# Patient Record
Sex: Female | Born: 1970 | Race: White | Hispanic: No | Marital: Married | Smoking: Never smoker
Health system: Southern US, Community
[De-identification: ages and names within clinical notes are randomized; demographics above are authoritative.]

## PROBLEM LIST (undated history)

## (undated) DIAGNOSIS — I10 Essential (primary) hypertension: Secondary | ICD-10-CM

## (undated) HISTORY — PX: DILATION AND CURETTAGE OF UTERUS: SHX78

## (undated) HISTORY — DX: Essential (primary) hypertension: I10

## (undated) HISTORY — PX: OTHER SURGICAL HISTORY: SHX169

---

## 1998-02-18 ENCOUNTER — Ambulatory Visit (HOSPITAL_COMMUNITY): Admission: RE | Admit: 1998-02-18 | Discharge: 1998-02-18 | Payer: Self-pay | Admitting: Obstetrics and Gynecology

## 1998-07-12 ENCOUNTER — Encounter: Payer: Self-pay | Admitting: Obstetrics and Gynecology

## 1998-07-12 ENCOUNTER — Ambulatory Visit (HOSPITAL_COMMUNITY): Admission: RE | Admit: 1998-07-12 | Discharge: 1998-07-12 | Payer: Self-pay | Admitting: Obstetrics and Gynecology

## 2000-02-19 ENCOUNTER — Other Ambulatory Visit: Admission: RE | Admit: 2000-02-19 | Discharge: 2000-02-19 | Payer: Self-pay | Admitting: Obstetrics and Gynecology

## 2000-09-14 ENCOUNTER — Inpatient Hospital Stay (HOSPITAL_COMMUNITY): Admission: AD | Admit: 2000-09-14 | Discharge: 2000-09-17 | Payer: Self-pay | Admitting: Obstetrics and Gynecology

## 2000-10-10 ENCOUNTER — Other Ambulatory Visit: Admission: RE | Admit: 2000-10-10 | Discharge: 2000-10-10 | Payer: Self-pay | Admitting: Obstetrics and Gynecology

## 2001-07-21 ENCOUNTER — Inpatient Hospital Stay (HOSPITAL_COMMUNITY): Admission: AD | Admit: 2001-07-21 | Discharge: 2001-07-21 | Payer: Self-pay | Admitting: Obstetrics and Gynecology

## 2001-07-28 ENCOUNTER — Inpatient Hospital Stay (HOSPITAL_COMMUNITY): Admission: AD | Admit: 2001-07-28 | Discharge: 2001-07-30 | Payer: Self-pay | Admitting: *Deleted

## 2001-09-11 ENCOUNTER — Other Ambulatory Visit: Admission: RE | Admit: 2001-09-11 | Discharge: 2001-09-11 | Payer: Self-pay | Admitting: Obstetrics and Gynecology

## 2002-09-25 ENCOUNTER — Other Ambulatory Visit: Admission: RE | Admit: 2002-09-25 | Discharge: 2002-09-25 | Payer: Self-pay | Admitting: Obstetrics and Gynecology

## 2003-09-28 ENCOUNTER — Other Ambulatory Visit: Admission: RE | Admit: 2003-09-28 | Discharge: 2003-09-28 | Payer: Self-pay | Admitting: Obstetrics and Gynecology

## 2005-10-25 ENCOUNTER — Emergency Department (HOSPITAL_COMMUNITY): Admission: EM | Admit: 2005-10-25 | Discharge: 2005-10-25 | Payer: Self-pay | Admitting: Emergency Medicine

## 2008-04-21 ENCOUNTER — Emergency Department (HOSPITAL_BASED_OUTPATIENT_CLINIC_OR_DEPARTMENT_OTHER): Admission: EM | Admit: 2008-04-21 | Discharge: 2008-04-21 | Payer: Self-pay | Admitting: Emergency Medicine

## 2008-07-29 ENCOUNTER — Encounter: Admission: RE | Admit: 2008-07-29 | Discharge: 2008-07-29 | Payer: Self-pay | Admitting: Gastroenterology

## 2010-04-20 ENCOUNTER — Encounter: Admission: RE | Admit: 2010-04-20 | Discharge: 2010-04-20 | Payer: Self-pay | Admitting: Obstetrics and Gynecology
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
## 2010-10-20 NOTE — Op Note (Signed)
[facility]  Patient:    Brenda Roberson, Brenda Roberson                        MRN: [id number] Proc. Date: 09/15/00 Adm. Date:  [id number] Attending:  Genia Del                           Operative Report  INDICATIONS:                  For operative delivery and maternal exhaustion, prolonged second stage and nonreassuring fetal heart rate status.  DESCRIPTION OF PROCEDURE:     After being apprised of the risks and benefits of vacuum-assisted vaginal delivery, a Mityvac mushroom cup is applied to OA presentation after Foley catheter has been removed within 15 minutes after proper presentation.  Two pulls during contractions with gentle lateral traction for delivery of a full-term living female, Apgars 9 and 9, over central median episiotomy is accomplished.  Placenta delivered spontaneously intact.  Three-vessel cord noted.  No lacerations or extensions are noted.  No cervical lacerations are noted.  Repair of episiotomy with a 3-0 Monocryl in the standard fashion is performed.  Estimated blood loss 300 cc.  No complications.  Baby and mother are recovering well.  The patient tolerated the procedure well.DD:  09/15/00 TD:  09/15/00 Job: [id number]

## 2010-10-20 NOTE — H&P (Signed)
[facility]  Patient:    Brenda Roberson, Brenda Roberson Visit Number: [id number] MRN: [id number]          Service Type: OBS Location: 910B [id number] [id number] Attending Physician:  Ermalene Searing Dictated by:   Lenoard Aden, M.D. Admit Date:  07/28/2001                           History and Physical  CHIEF COMPLAINT:              Labor.  HISTORY OF PRESENT ILLNESS:   The patient is a 40 year old white female, G4, P48, EDD of August 05, 2001, at 38+ weeks who presents for contractions every five minutes for admission and augmentation of labor as needed.  ALLERGIES:                    No known drug allergies.  MEDICATIONS:                  Prenatal vitamins.  PAST OBSTETRIC HISTORY:       1. History of SAB 1999 and 2000.                               2. Uncomplicated vaginal delivery April 2002.  FAMILY HISTORY:               Myocardial infarction, hypertension, insulin-dependent diabetes, and lung cancer.  PRENATAL LABORATORY DATA:     Blood type A positive, Rh antibody negative, Rubella immune, hepatitis B surface antigen negative, HIV nonreactive. PHYSICAL EXAMINATION:  GENERAL:                      Well-developed, well-nourished, white female in no apparent distress.  HEENT:                        Normal.  LUNGS:                        Clear.  HEART:                        Regular rate and rhythm.  ABDOMEN:                      Soft, gravid, nontender.  PELVIC:                       Cervix is 3 cm, 60% vertex, -1.  EXTREMITIES:                  Feel no cords.  NEUROLOGIC:                   Nonfocal.  IMPRESSION:                   A 38+ week intrauterine pregnancy in early labor.  PLAN:                         Proceed with admission and group B streptococcus prophylaxis. Dictated by:   Lenoard Aden, M.D. Attending Physician:  Marina Gravel B DD:  07/28/01 TD:  07/28/01 Job: [id number]

## 2011-03-07 LAB — CBC
Hemoglobin: 13.9
MCHC: 34.2
MCV: 90.9
RBC: 4.45
WBC: 8.4

## 2011-03-07 LAB — URINALYSIS, ROUTINE W REFLEX MICROSCOPIC
Bilirubin Urine: NEGATIVE
Hgb urine dipstick: NEGATIVE
Nitrite: NEGATIVE
Specific Gravity, Urine: 1.011
pH: 6

## 2011-03-07 LAB — DIFFERENTIAL
Lymphocytes Relative: 22
Monocytes Absolute: 0.4
Monocytes Relative: 5
Neutro Abs: 6.2

## 2011-03-07 LAB — COMPREHENSIVE METABOLIC PANEL
AST: 20
Albumin: 4.3
CO2: 26
Chloride: 105
Creatinine, Ser: 0.7
Glucose, Bld: 93
Sodium: 140
Total Protein: 7.4

## 2012-11-02 ENCOUNTER — Emergency Department (HOSPITAL_BASED_OUTPATIENT_CLINIC_OR_DEPARTMENT_OTHER)
Admission: EM | Admit: 2012-11-02 | Discharge: 2012-11-03 | Disposition: A | Discharge status: Home / Self Care | Payer: BC Managed Care – PPO | Attending: Emergency Medicine | Admitting: Emergency Medicine

## 2012-11-02 ENCOUNTER — Encounter (HOSPITAL_BASED_OUTPATIENT_CLINIC_OR_DEPARTMENT_OTHER): Payer: Self-pay

## 2012-11-02 ENCOUNTER — Emergency Department (HOSPITAL_BASED_OUTPATIENT_CLINIC_OR_DEPARTMENT_OTHER): Payer: BC Managed Care – PPO

## 2012-11-02 DIAGNOSIS — Z8719 Personal history of other diseases of the digestive system: Secondary | ICD-10-CM | POA: Insufficient documentation

## 2012-11-02 DIAGNOSIS — Z3202 Encounter for pregnancy test, result negative: Secondary | ICD-10-CM | POA: Insufficient documentation

## 2012-11-02 DIAGNOSIS — R102 Pelvic and perineal pain: Secondary | ICD-10-CM

## 2012-11-02 DIAGNOSIS — Z79899 Other long term (current) drug therapy: Secondary | ICD-10-CM | POA: Insufficient documentation

## 2012-11-02 DIAGNOSIS — N949 Unspecified condition associated with female genital organs and menstrual cycle: Secondary | ICD-10-CM | POA: Insufficient documentation

## 2012-11-02 DIAGNOSIS — K921 Melena: Secondary | ICD-10-CM | POA: Insufficient documentation

## 2012-11-02 DIAGNOSIS — R110 Nausea: Secondary | ICD-10-CM | POA: Insufficient documentation

## 2012-11-02 DIAGNOSIS — Z8742 Personal history of other diseases of the female genital tract: Secondary | ICD-10-CM | POA: Insufficient documentation

## 2012-11-02 LAB — URINALYSIS, ROUTINE W REFLEX MICROSCOPIC
Glucose, UA: NEGATIVE mg/dL
Hgb urine dipstick: NEGATIVE
Specific Gravity, Urine: 1.021 (ref 1.005–1.030)
pH: 6 (ref 5.0–8.0)

## 2012-11-02 LAB — WET PREP, GENITAL
Clue Cells Wet Prep HPF POC: NONE SEEN
Trich, Wet Prep: NONE SEEN

## 2012-11-02 LAB — PREGNANCY, URINE: Preg Test, Ur: NEGATIVE

## 2012-11-02 MED ORDER — IOHEXOL 300 MG/ML  SOLN: 50.0000 mL | Freq: Once | INTRAMUSCULAR | Status: AC | PRN

## 2012-11-02 MED ORDER — ONDANSETRON 4 MG PO TBDP
ORAL_TABLET | ORAL | Status: AC
  Administered 2012-11-02: 4 mg
  Filled 2012-11-02: qty 1

## 2012-11-02 MED ORDER — ONDANSETRON HCL 8 MG PO TABS: 4.0000 mg | ORAL_TABLET | Freq: Once | ORAL | Status: DC

## 2012-11-02 MED ORDER — HYDROCODONE-ACETAMINOPHEN 5-325 MG PO TABS
1.0000 | ORAL_TABLET | Freq: Once | ORAL | Status: AC
  Administered 2012-11-02: 1 via ORAL
  Filled 2012-11-02: qty 1

## 2012-11-02 NOTE — ED Notes (Signed)
Patient here with lower abdominal pain that she reports as constant since early am. Reports constipation since last pm and noticed blood in her stool. Nausea with same. Denies urinary symptoms

## 2012-11-02 NOTE — ED Provider Notes (Signed)
History     CSN: [id number]  Arrival date & time 11/02/12  4098   First MD Initiated Contact with Patient 11/02/12 2157      Chief Complaint  Patient presents with  . Abdominal Pain    (Consider location/radiation/quality/duration/timing/severity/associated sxs/prior treatment) Patient is a 42 y.o. female presenting with abdominal pain. The history is provided by the patient. No language interpreter was used.  Abdominal Pain This is a new problem. The current episode started today. The problem occurs constantly. Associated symptoms include abdominal pain and nausea. Pertinent negatives include no chest pain, fever, myalgias, rash or weakness. Associated symptoms comments: Lower abdominal pain that has been constant since this morning. No fever or vomiting. Last bowel movement was yesterday, which is not her usual habit. She also reports seeing blood in her stool yesterday. 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 Loreta Ave. No dysuria or vaginal discharge. Her menses have been regular. .    History reviewed. No pertinent past medical history.  History reviewed. No pertinent past surgical history.  No family history on file.  History  Substance Use Topics  . Smoking status: Never Smoker   . Smokeless tobacco: Not on file  . Alcohol Use: Not on file    OB History   Grav Para Term Preterm Abortions TAB SAB Ect Mult Living                  Review of Systems  Constitutional: Negative.  Negative for fever.  Respiratory: Negative for shortness of breath.   Cardiovascular: Negative for chest pain.  Gastrointestinal: Positive for nausea, abdominal pain and blood in stool. Negative for diarrhea.  Genitourinary: Negative for dysuria, vaginal bleeding and vaginal discharge.  Musculoskeletal: Negative for myalgias.  Skin: Negative for rash.  Neurological: Negative for weakness.    Allergies  Review of patient's allergies indicates no known allergies.  Home Medications    Current Outpatient Rx  Name  Route  Sig  Dispense  Refill  . cetirizine (ZYRTEC) 10 MG tablet   Oral   Take 10 mg by mouth daily.           BP 161/81  Pulse 68  Temp(Src) 98.4 F (36.9 C) (Oral)  Ht 5' 2.5" (1.588 m)  Wt 182 lb (82.555 kg)  BMI 32.74 kg/m2  SpO2 98%  LMP 10/26/2012  Physical Exam  Constitutional: She is oriented to person, place, and time. She appears well-developed and well-nourished.  HENT:  Head: Normocephalic.  Neck: Normal range of motion. Neck supple.  Cardiovascular: Normal rate and regular rhythm.   Pulmonary/Chest: Effort normal and breath sounds normal.  Abdominal: Soft. Bowel sounds are normal. There is tenderness. There is no rebound and no guarding.  Suprapubic and LLQ abdominal tenderness without guarding.   Genitourinary:  No cervical discharge. Mild midline pelvic and left adnexal tenderness without mass.   Musculoskeletal: Normal range of motion.  Neurological: She is alert and oriented to person, place, and time.  Skin: Skin is warm and dry. No rash noted.  Psychiatric: She has a normal mood and affect.    ED Course  Procedures (including critical care time)  Labs Reviewed  WET PREP, GENITAL  GC/CHLAMYDIA PROBE AMP  URINALYSIS, ROUTINE W REFLEX MICROSCOPIC  PREGNANCY, URINE  OCCULT BLOOD X 1 CARD TO LAB, STOOL  CBC WITH DIFFERENTIAL  BASIC METABOLIC PANEL   No results found.   No diagnosis found.    MDM  Patient with significant abdominal pain and history  of IBS, as well as ovarian cyst. Ct to evaluate further. Patient care transferred to Dr. Read Drivers pending blood studies and CT. Patient and husband made aware of plan.       Arnoldo Hooker, PA-C 11/02/12 2351

## 2012-11-03 LAB — CBC WITH DIFFERENTIAL/PLATELET
Eosinophils Absolute: 0.1 10*3/uL (ref 0.0–0.7)
Lymphocytes Relative: 32 % (ref 12–46)
Lymphs Abs: 2.1 10*3/uL (ref 0.7–4.0)
MCH: 32.2 pg (ref 26.0–34.0)
Neutrophils Relative %: 59 % (ref 43–77)
Platelets: 241 10*3/uL (ref 150–400)
RBC: 4.41 MIL/uL (ref 3.87–5.11)
WBC: 6.6 10*3/uL (ref 4.0–10.5)

## 2012-11-03 LAB — GC/CHLAMYDIA PROBE AMP
CT Probe RNA: NEGATIVE
GC Probe RNA: NEGATIVE

## 2012-11-03 LAB — BASIC METABOLIC PANEL
GFR calc non Af Amer: 89 mL/min — ABNORMAL LOW (ref >90)
Glucose, Bld: 104 mg/dL — ABNORMAL HIGH (ref 70–99)
Potassium: 4.1 mEq/L (ref 3.5–5.1)
Sodium: 140 mEq/L (ref 135–145)

## 2012-11-03 MED ORDER — IOHEXOL 300 MG/ML  SOLN
50.0000 mL | Freq: Once | INTRAMUSCULAR | Status: AC | PRN
  Administered 2012-11-02: 50 mL via ORAL

## 2012-11-03 MED ORDER — IOHEXOL 300 MG/ML  SOLN
100.0000 mL | Freq: Once | INTRAMUSCULAR | Status: AC | PRN
  Administered 2012-11-03: 100 mL via INTRAVENOUS

## 2012-11-03 NOTE — ED Provider Notes (Signed)
Medical screening examination/treatment/procedure(s) were conducted as a shared visit with non-physician practitioner(s) and myself.  I personally evaluated the patient during the encounter  Nursing notes and vitals signs, including pulse oximetry, reviewed.  Summary of this visit's results, reviewed by myself:  Labs:  Results for orders placed during the hospital encounter of 11/02/12 (from the past 24 hour(s))  URINALYSIS, ROUTINE W REFLEX MICROSCOPIC     Status: None   Collection Time    11/02/12  8:34 PM      Result Value Range   Color, Urine YELLOW  YELLOW   APPearance CLEAR  CLEAR   Specific Gravity, Urine 1.021  1.005 - 1.030   pH 6.0  5.0 - 8.0   Glucose, UA NEGATIVE  NEGATIVE mg/dL   Hgb urine dipstick NEGATIVE  NEGATIVE   Bilirubin Urine NEGATIVE  NEGATIVE   Ketones, ur NEGATIVE  NEGATIVE mg/dL   Protein, ur NEGATIVE  NEGATIVE mg/dL   Urobilinogen, UA 0.2  0.0 - 1.0 mg/dL   Nitrite NEGATIVE  NEGATIVE   Leukocytes, UA NEGATIVE  NEGATIVE  PREGNANCY, URINE     Status: None   Collection Time    11/02/12  8:34 PM      Result Value Range   Preg Test, Ur NEGATIVE  NEGATIVE  OCCULT BLOOD X 1 CARD TO LAB, STOOL     Status: None   Collection Time    11/02/12 11:21 PM      Result Value Range   Fecal Occult Bld NEGATIVE  NEGATIVE  WET PREP, GENITAL     Status: None   Collection Time    11/02/12 11:29 PM      Result Value Range   Yeast Wet Prep HPF POC NONE SEEN  NONE SEEN   Trich, Wet Prep NONE SEEN  NONE SEEN   Clue Cells Wet Prep HPF POC NONE SEEN  NONE SEEN   WBC, Wet Prep HPF POC NONE SEEN  NONE SEEN  CBC WITH DIFFERENTIAL     Status: None   Collection Time    11/02/12 11:59 PM      Result Value Range   WBC 6.6  4.0 - 10.5 K/uL   RBC 4.41  3.87 - 5.11 MIL/uL   Hemoglobin 14.2  12.0 - 15.0 g/dL   HCT 10.2  72.5 - 36.6 %   MCV 91.6  78.0 - 100.0 fL   MCH 32.2  26.0 - 34.0 pg   MCHC 35.1  30.0 - 36.0 g/dL   RDW 44.0  34.7 - 42.5 %   Platelets 241  150 - 400 K/uL    Neutrophils Relative % 59  43 - 77 %   Neutro Abs 3.9  1.7 - 7.7 K/uL   Lymphocytes Relative 32  12 - 46 %   Lymphs Abs 2.1  0.7 - 4.0 K/uL   Monocytes Relative 8  3 - 12 %   Monocytes Absolute 0.5  0.1 - 1.0 K/uL   Eosinophils Relative 1  0 - 5 %   Eosinophils Absolute 0.1  0.0 - 0.7 K/uL   Basophils Relative 0  0 - 1 %   Basophils Absolute 0.0  0.0 - 0.1 K/uL  BASIC METABOLIC PANEL     Status: Abnormal   Collection Time    11/02/12 11:59 PM      Result Value Range   Sodium 140  135 - 145 mEq/L   Potassium 4.1  3.5 - 5.1 mEq/L   Chloride 103  96 -  112 mEq/L   CO2 26  19 - 32 mEq/L   Glucose, Bld 104 (*) 70 - 99 mg/dL   BUN 11  6 - 23 mg/dL   Creatinine, Ser 1.91  0.50 - 1.10 mg/dL   Calcium 9.3  8.4 - 47.8 mg/dL   GFR calc non Af Amer 89 (*) >90 mL/min   GFR calc Af Amer >90  >90 mL/min    Imaging Studies: Ct Abdomen Pelvis W Contrast  11/03/2012   *RADIOLOGY REPORT*  Clinical Data: Low abdominal pain, constipation  CT ABDOMEN AND PELVIS WITH CONTRAST  Technique:  Multidetector CT imaging of the abdomen and pelvis was performed following the standard protocol during bolus administration of intravenous contrast.  Contrast: OMNIPAQUE IOHEXOL 300 MG/ML  SOLN 07/29/2008  Comparison: 07/29/2008  Findings: The lung bases are clear.  Small hiatal hernia noted. Liver, gallbladder, spleen, pancreas, adrenal glands, and kidneys are normal.  No free air or fluid.  Uterus and ovaries are normal.  No pelvic free fluid.  The appendix is normal, image 56.  No bowel wall thickening or focal segmental dilatation.  No acute osseous abnormality.  IMPRESSION: No acute intra-abdominal or pelvic pathology.   Original Report Authenticated By: Christiana Pellant, M.D.    1:25 AM Patient states she feels better. Mild left lower quadrant tenderness remains without guarding or rebound. She was advised that a pelvic ultrasound may be indicated, she will follow up with her OB/GYN regarding this.  Hanley Seamen, MD 11/03/12 [phone]

## 2012-11-03 NOTE — ED Notes (Signed)
Patient back from CT.

## 2012-11-05 NOTE — ED Provider Notes (Signed)
Medical screening examination/treatment/procedure(s) were performed by non-physician practitioner and as supervising physician I was immediately available for consultation/collaboration.   Anieya Helman W. Zayah Keilman, MD 11/05/12 0903 

## 2013-11-13 ENCOUNTER — Other Ambulatory Visit: Payer: Self-pay | Admitting: Family Medicine

## 2013-11-13 DIAGNOSIS — L989 Disorder of the skin and subcutaneous tissue, unspecified: Secondary | ICD-10-CM

## 2013-11-16 ENCOUNTER — Ambulatory Visit
Admission: RE | Admit: 2013-11-16 | Discharge: 2013-11-16 | Disposition: A | Discharge status: Home / Self Care | Payer: BC Managed Care – PPO | Source: Ambulatory Visit | Attending: Family Medicine | Admitting: Family Medicine

## 2013-11-16 DIAGNOSIS — L989 Disorder of the skin and subcutaneous tissue, unspecified: Secondary | ICD-10-CM

## 2014-12-15 IMAGING — CT CT ABD-PELV W/ CM
2 of 5 series · 17 of 46 positions shown, 19 images · IV contrast (APPLIED)
Comparison: 07/29/2008

CLINICAL DATA: Low abdominal pain, constipation

CT ABDOMEN AND PELVIS WITH CONTRAST
TECHNIQUE: Multidetector CT imaging of the abdomen and pelvis was
performed following the standard protocol during bolus
administration of intravenous contrast.
Contrast: 100mL OMNIPAQUE IOHEXOL 300 MG/ML  SOLN 07/29/2008

[Series 2: abd/pelvis 5.0 b31f · axial · 0.80mm/px · z∈[-445,+0]mm · 14 of 99 slices shown, 16 images]
[im 5/99  soft-tissue]
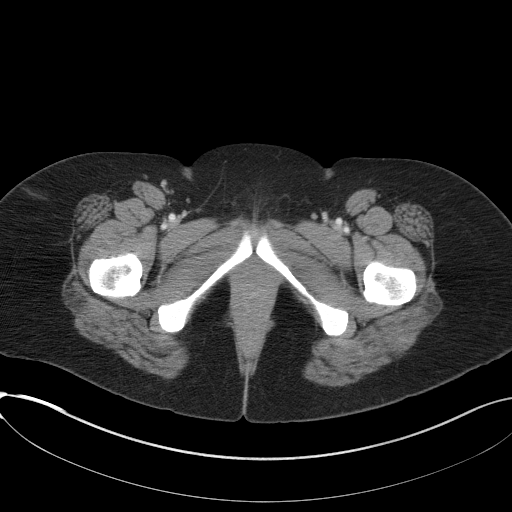
[im 5/99  bone]
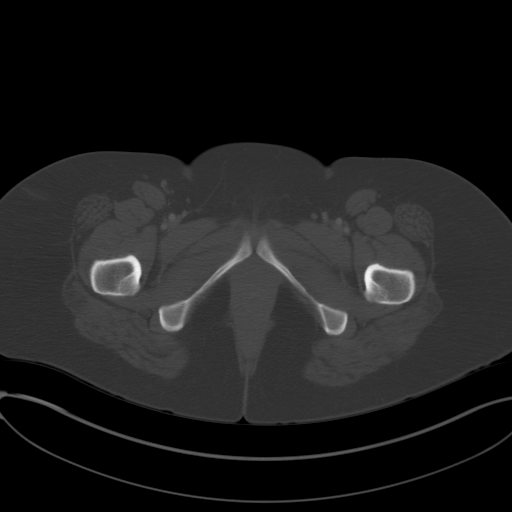
[im 15/99  soft-tissue]
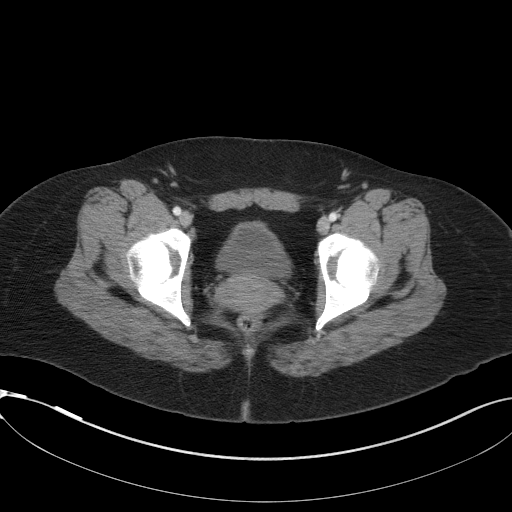
[im 20/99  soft-tissue]
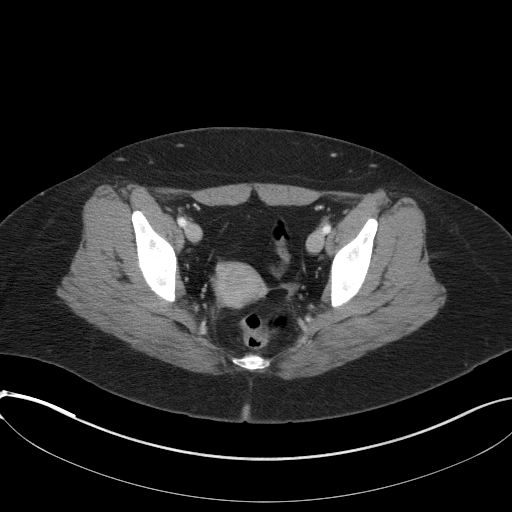
[im 25/99  soft-tissue]
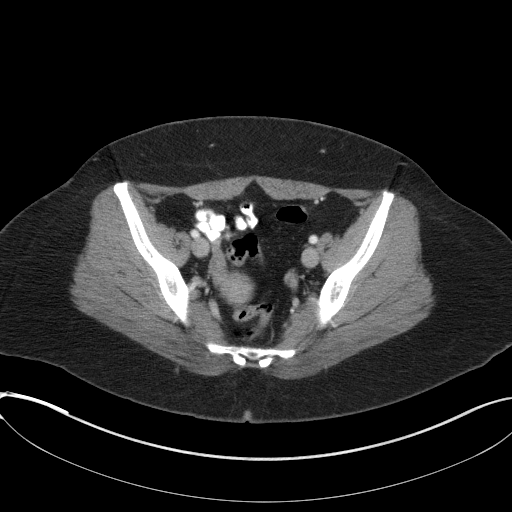
[im 35/99  soft-tissue]
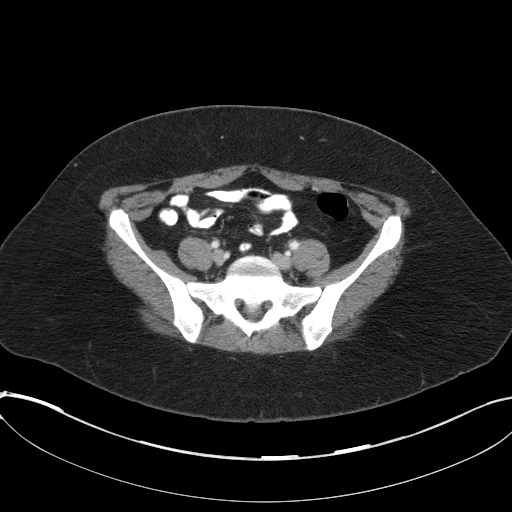
[im 40/99  soft-tissue]
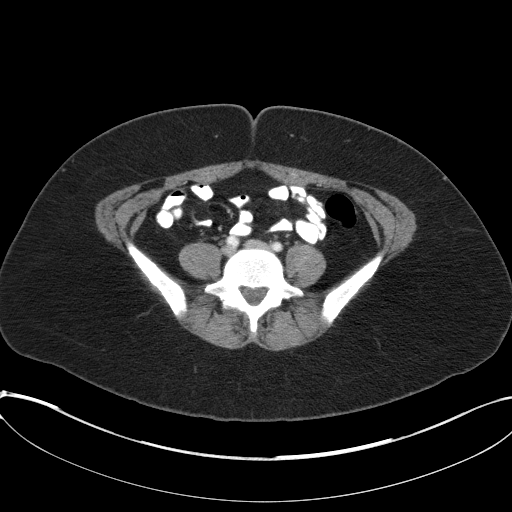
[im 45/99  soft-tissue]
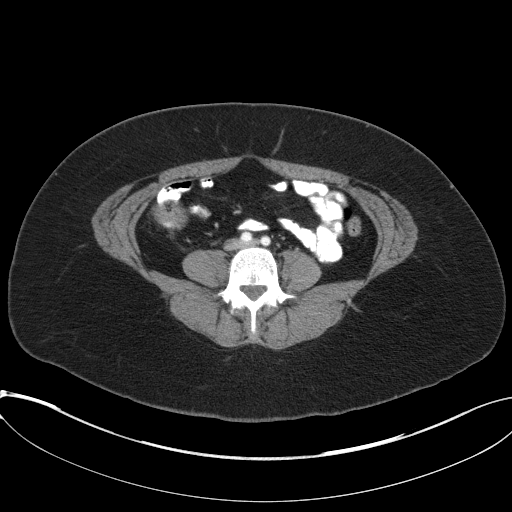
[im 54/99  soft-tissue]
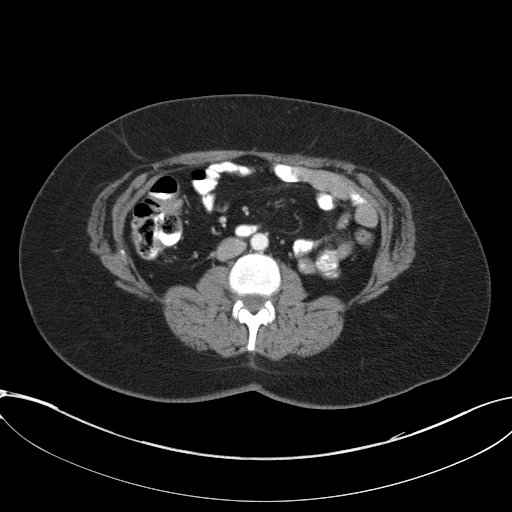
[im 59/99  soft-tissue]
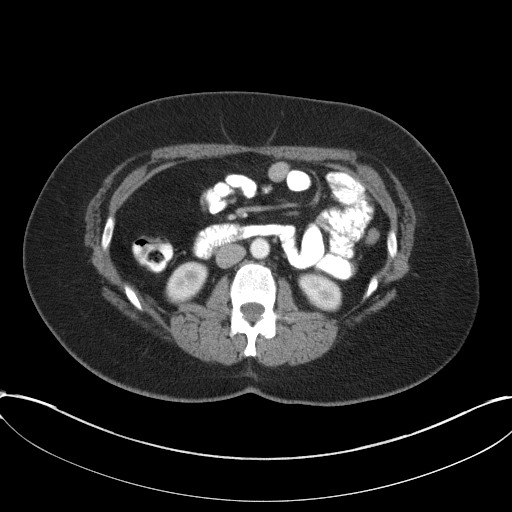
[im 59/99  bone]
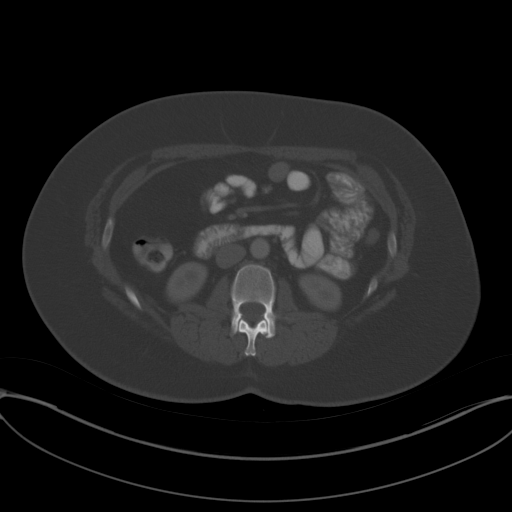
[im 64/99  soft-tissue]
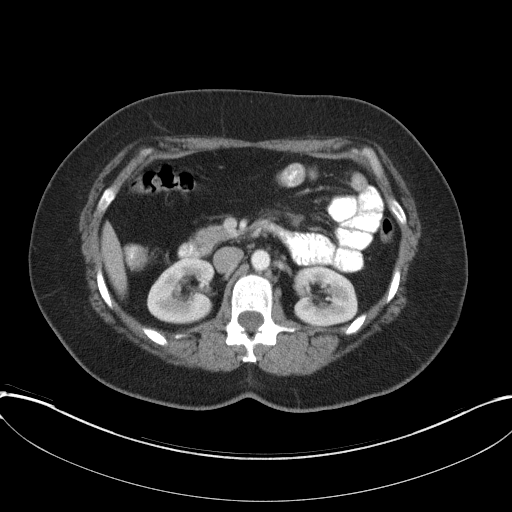
[im 74/99  soft-tissue]
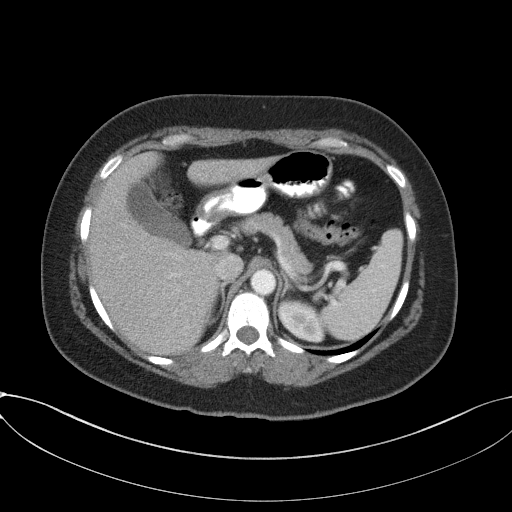
[im 79/99  soft-tissue]
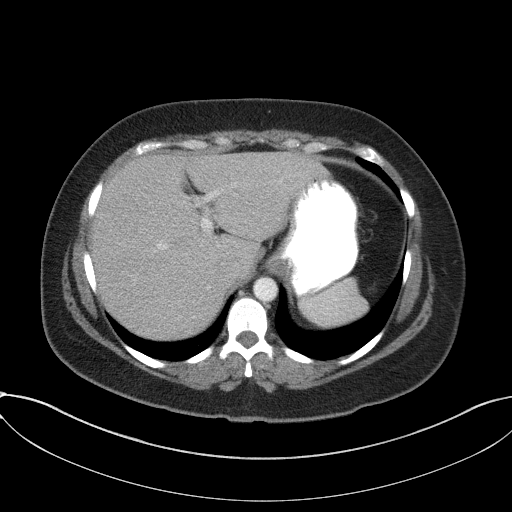
[im 84/99  soft-tissue]
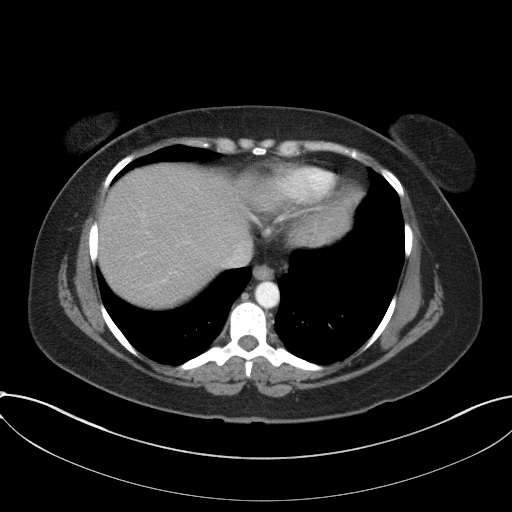
[im 94/99  soft-tissue]
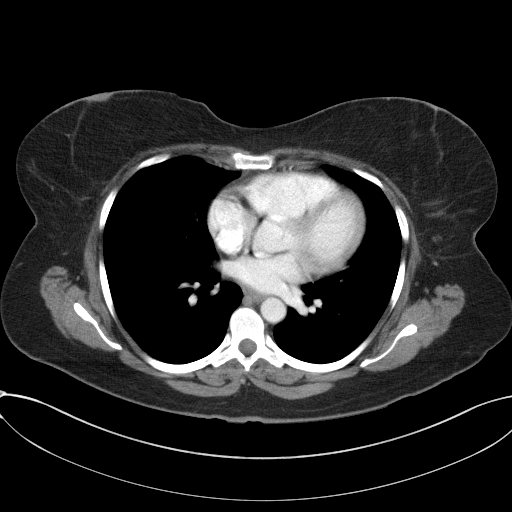

[Series 5: abd/pelvis 3.0 coronal · coronal · 1.08mm/px · 3 of 81 slices shown]
[im 27/81  soft-tissue]
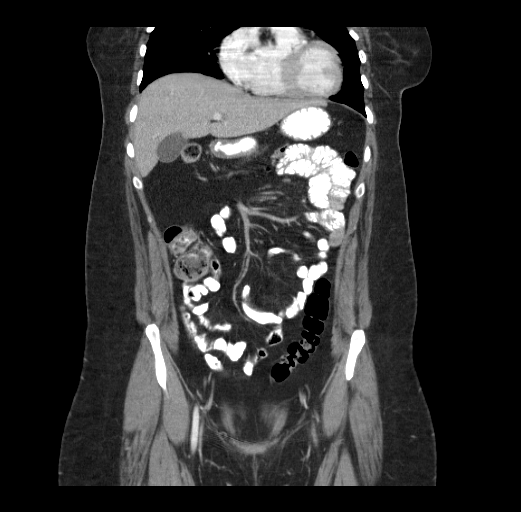
[im 36/81  soft-tissue]
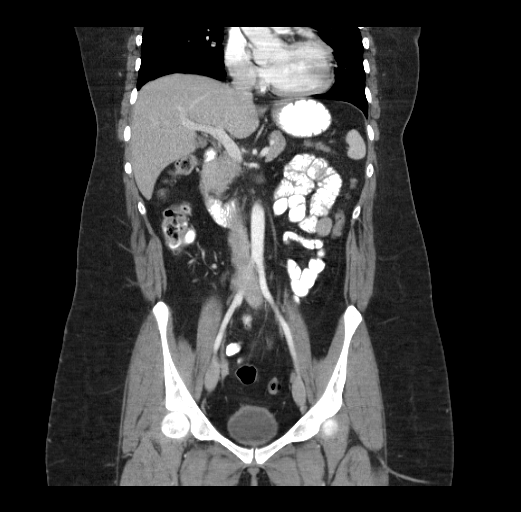
[im 45/81  soft-tissue]
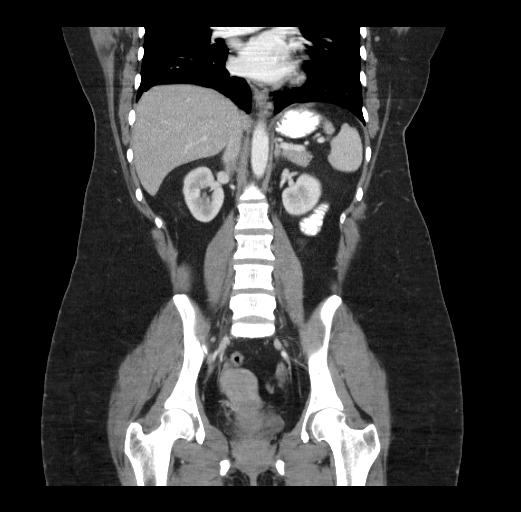

[17 of 46 positions shown; findings below may reference images not displayed]

FINDINGS: The lung bases are clear.  Small hiatal hernia noted.
Liver, gallbladder, spleen, pancreas, adrenal glands, and kidneys
are normal.  No free air or fluid.

Uterus and ovaries are normal.  No pelvic free fluid.  The appendix
is normal, image 56.  No bowel wall thickening or focal segmental
dilatation.  No acute osseous abnormality.
IMPRESSION: No acute intra-abdominal or pelvic pathology.

## 2015-12-28 IMAGING — US US EXTREM LOW*R* LIMITED
1 series · 14 of 18 positions shown · non-contrast
Comparison: Non

CLINICAL DATA: Skin lesion for 3 years.

EXAM:
ULTRASOUND Right LOWER EXTREMITY LIMITED
TECHNIQUE: Ultrasound examination of the lower extremity soft tissues was
performed in the area of clinical concern.

[Series 1: us extrem low*right* limited · 0.05mm/px · 14 of 18 slices shown]
[im 1/18]
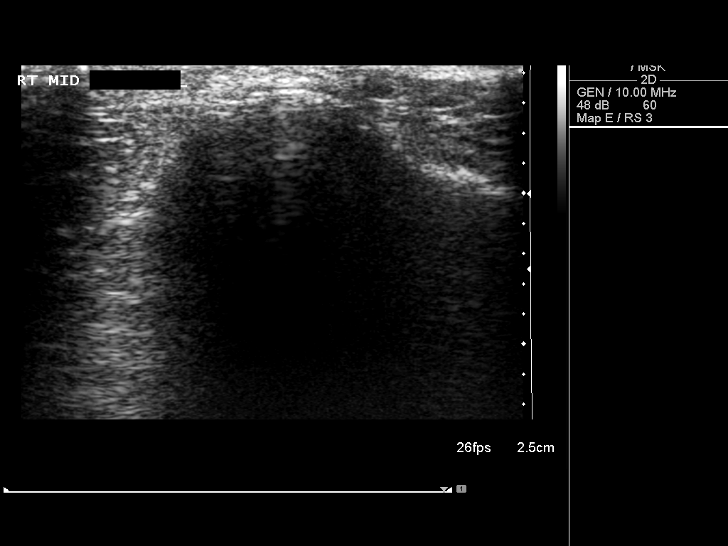
[im 2/18]
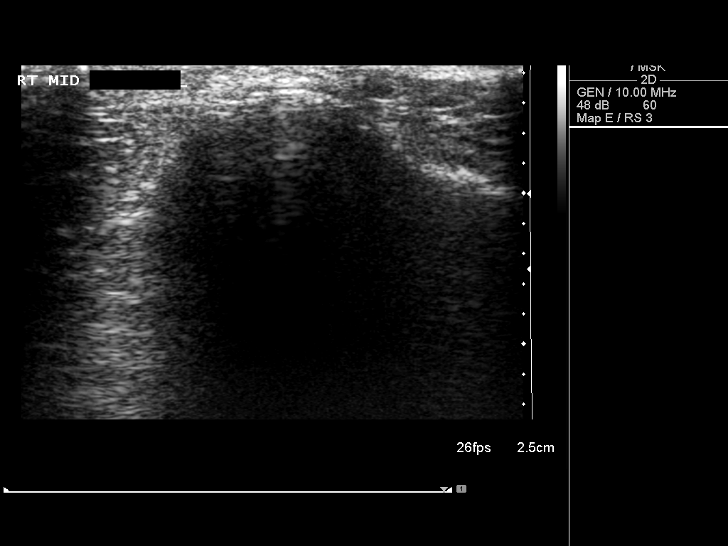
[im 4/18]
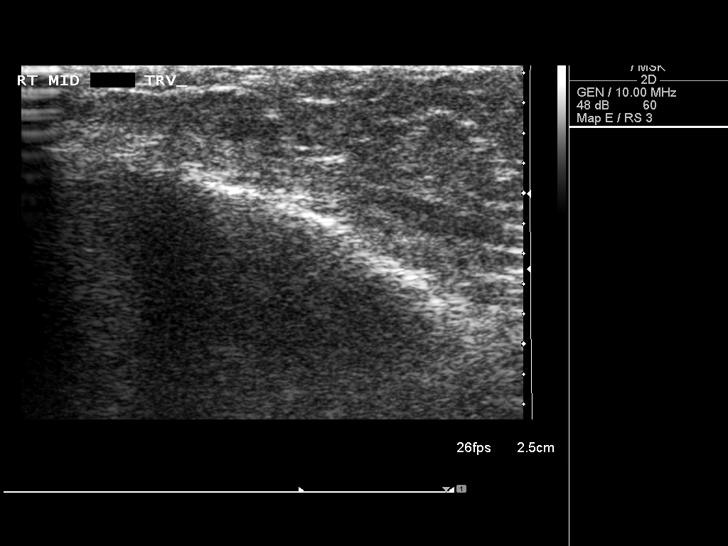
[im 5/18]
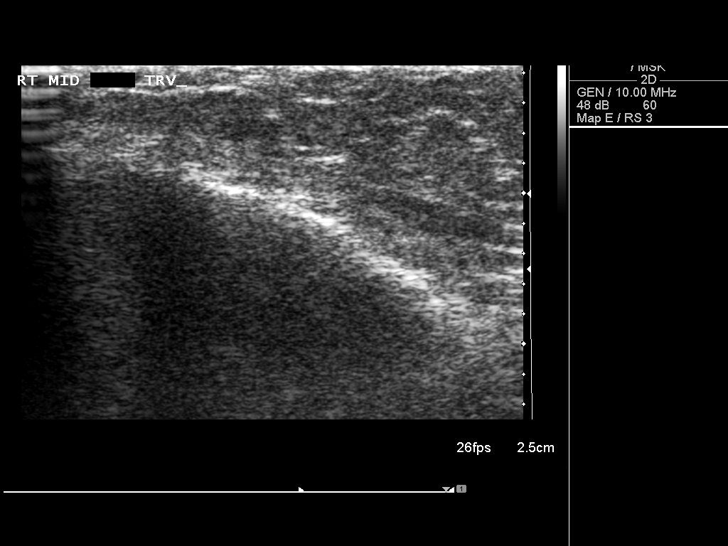
[im 6/18]
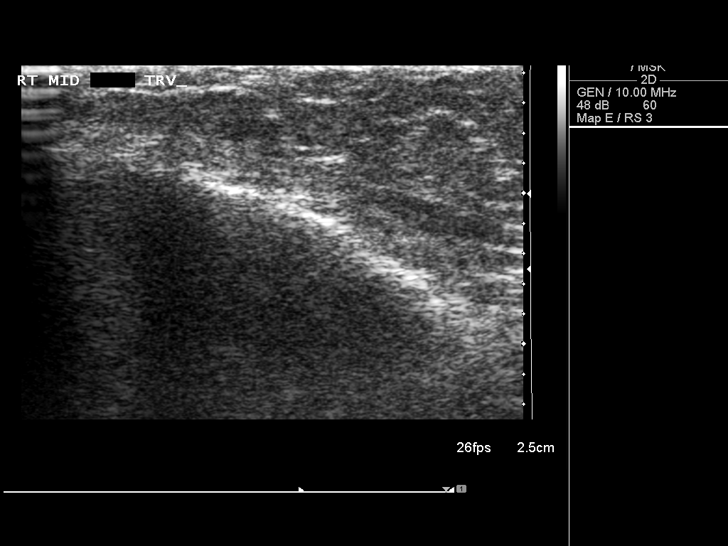
[im 8/18]
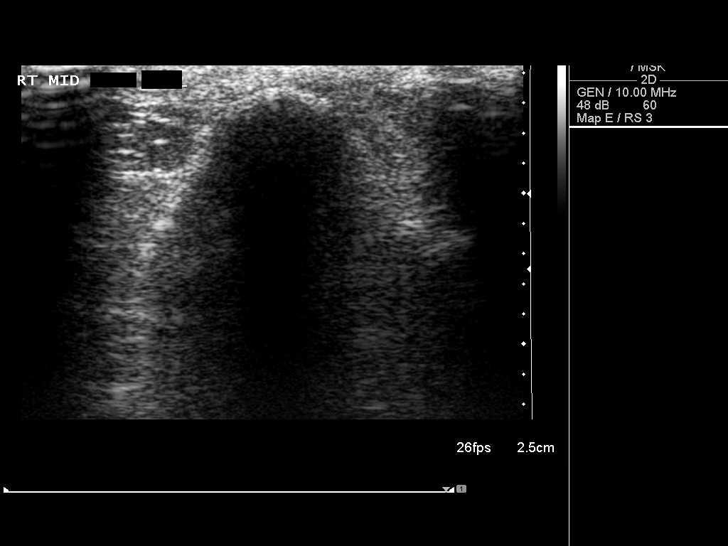
[im 9/18]
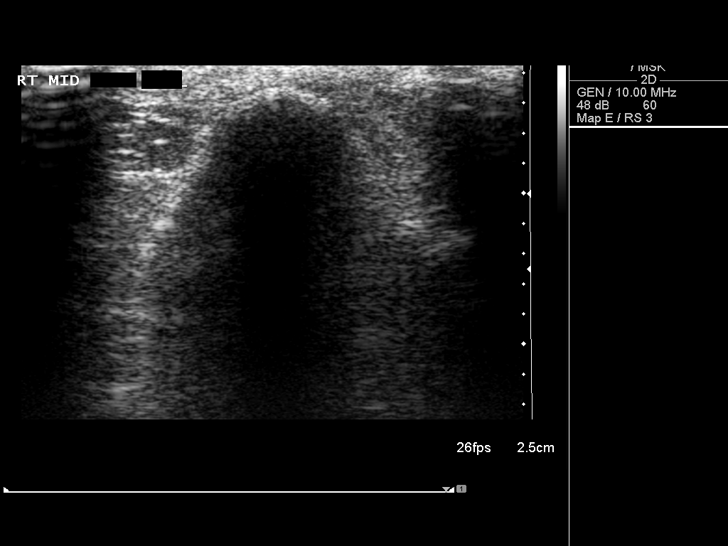
[im 10/18]
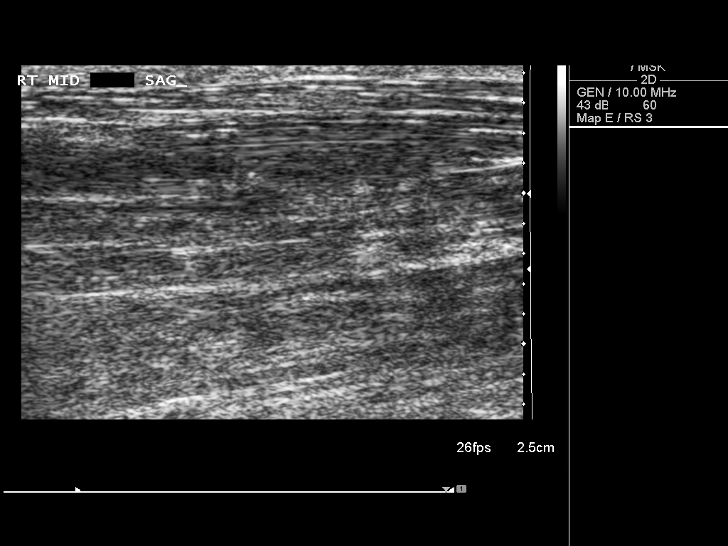
[im 11/18]
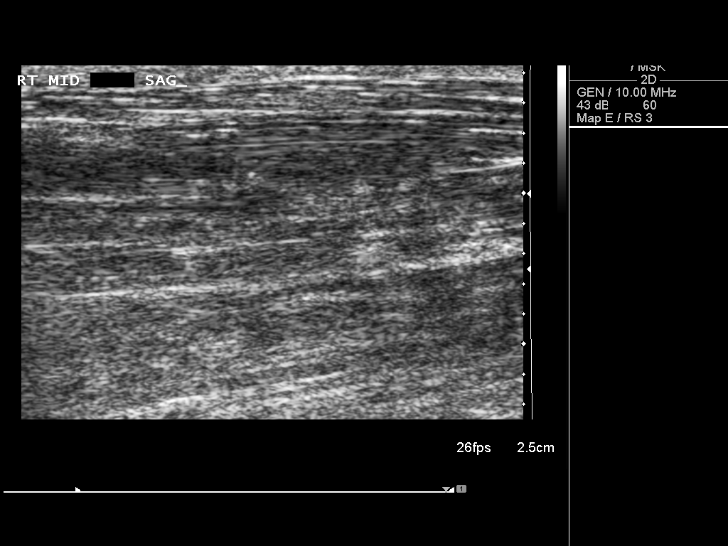
[im 13/18]
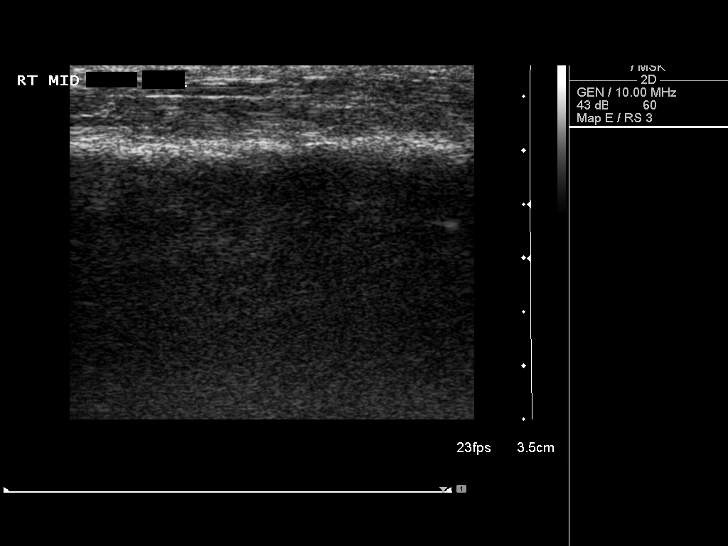
[im 14/18]
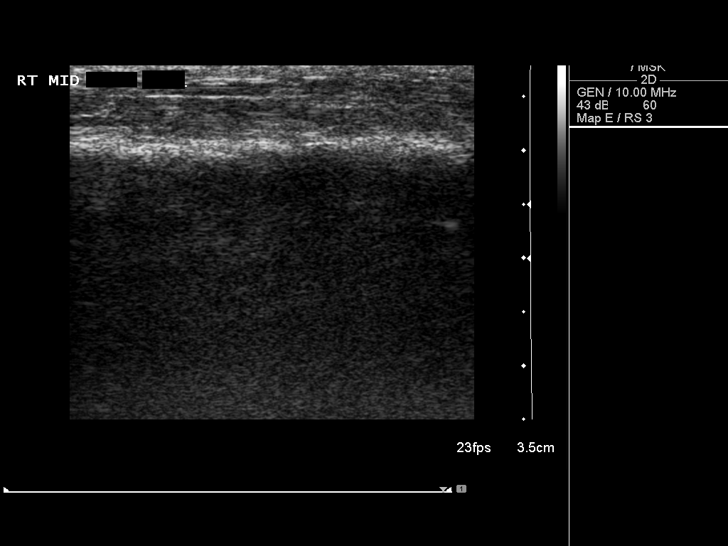
[im 15/18]
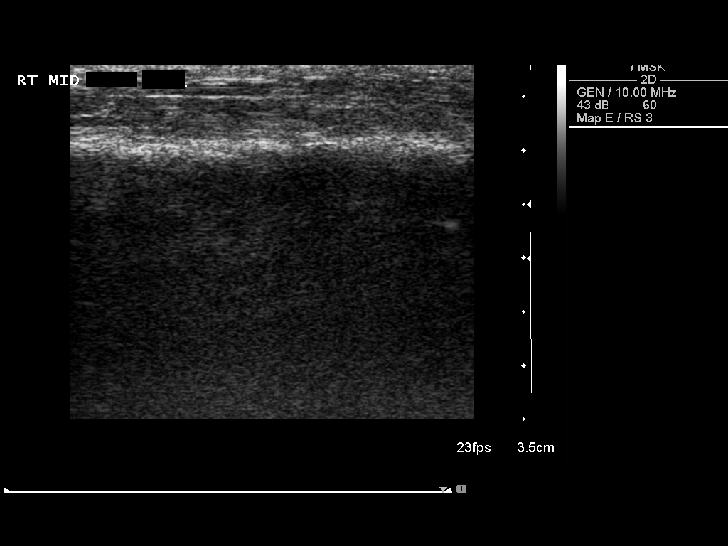
[im 17/18]
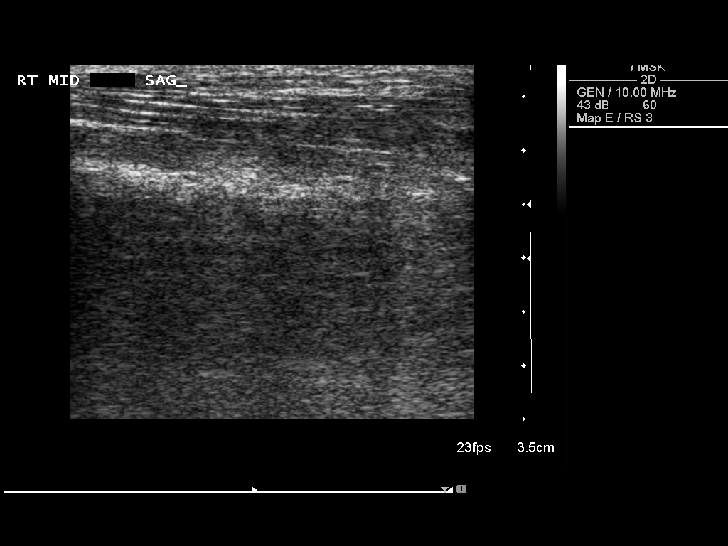
[im 18/18]
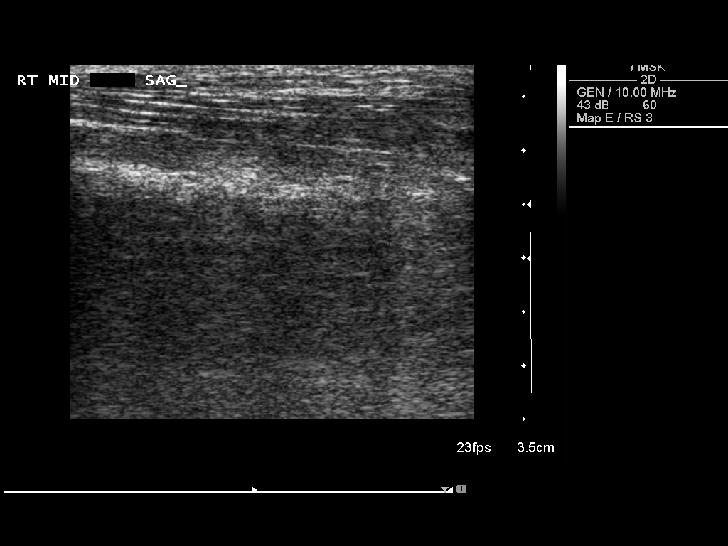

[14 of 18 positions shown; findings below may reference images not displayed]

FINDINGS: No significant abnormality of the regional subcutaneous or muscular
tissues is sonographically apparent.
IMPRESSION: 1. No sonographic abnormality is identified. If the patient's lesion
is painful or changing, MRI with and without contrast would be
recommended for definitive characterization.

## 2016-09-18 DIAGNOSIS — J0140 Acute pansinusitis, unspecified: Secondary | ICD-10-CM | POA: Diagnosis not present

## 2016-12-18 DIAGNOSIS — Z1231 Encounter for screening mammogram for malignant neoplasm of breast: Secondary | ICD-10-CM | POA: Diagnosis not present

## 2016-12-18 DIAGNOSIS — Z6833 Body mass index (BMI) 33.0-33.9, adult: Secondary | ICD-10-CM | POA: Diagnosis not present

## 2016-12-18 DIAGNOSIS — Z01419 Encounter for gynecological examination (general) (routine) without abnormal findings: Secondary | ICD-10-CM | POA: Diagnosis not present

## 2017-03-27 DIAGNOSIS — S61012A Laceration without foreign body of left thumb without damage to nail, initial encounter: Secondary | ICD-10-CM | POA: Diagnosis not present

## 2017-03-27 DIAGNOSIS — S61211A Laceration without foreign body of left index finger without damage to nail, initial encounter: Secondary | ICD-10-CM | POA: Diagnosis not present

## 2017-06-17 DIAGNOSIS — Z0001 Encounter for general adult medical examination with abnormal findings: Secondary | ICD-10-CM | POA: Diagnosis not present

## 2017-06-17 DIAGNOSIS — R232 Flushing: Secondary | ICD-10-CM | POA: Diagnosis not present

## 2017-06-17 DIAGNOSIS — E669 Obesity, unspecified: Secondary | ICD-10-CM | POA: Diagnosis not present

## 2017-11-26 DIAGNOSIS — L237 Allergic contact dermatitis due to plants, except food: Secondary | ICD-10-CM | POA: Diagnosis not present

## 2018-01-28 DIAGNOSIS — L259 Unspecified contact dermatitis, unspecified cause: Secondary | ICD-10-CM | POA: Diagnosis not present

## 2018-01-29 DIAGNOSIS — Z6834 Body mass index (BMI) 34.0-34.9, adult: Secondary | ICD-10-CM | POA: Diagnosis not present

## 2018-01-29 DIAGNOSIS — Z1231 Encounter for screening mammogram for malignant neoplasm of breast: Secondary | ICD-10-CM | POA: Diagnosis not present

## 2018-01-29 DIAGNOSIS — Z1151 Encounter for screening for human papillomavirus (HPV): Secondary | ICD-10-CM | POA: Diagnosis not present

## 2018-01-29 DIAGNOSIS — Z01419 Encounter for gynecological examination (general) (routine) without abnormal findings: Secondary | ICD-10-CM | POA: Diagnosis not present

## 2018-07-09 DIAGNOSIS — Z0000 Encounter for general adult medical examination without abnormal findings: Secondary | ICD-10-CM | POA: Diagnosis not present

## 2018-07-09 DIAGNOSIS — Z13220 Encounter for screening for lipoid disorders: Secondary | ICD-10-CM | POA: Diagnosis not present

## 2018-08-14 DIAGNOSIS — R509 Fever, unspecified: Secondary | ICD-10-CM | POA: Diagnosis not present

## 2019-07-17 ENCOUNTER — Ambulatory Visit: Payer: Self-pay | Attending: Internal Medicine

## 2019-07-17 DIAGNOSIS — Z23 Encounter for immunization: Secondary | ICD-10-CM

## 2019-07-17 NOTE — Progress Notes (Signed)
   Covid-19 Vaccination Clinic  Name:  LU PARADISE    MRN: [id number] DOB: [birth year]  07/17/2019  Ms. Fraiser was observed post Covid-19 immunization for 15 minutes without incidence. She was provided with Vaccine Information Sheet and instruction to access the V-Safe system.   Ms. Rosenbach was instructed to call 911 with any severe reactions post vaccine: Marland Kitchen Difficulty breathing  . Swelling of your face and throat  . A fast heartbeat  . A bad rash all over your body  . Dizziness and weakness    Immunizations Administered    Name Date Dose VIS Date Route   Pfizer COVID-19 Vaccine 07/17/2019  1:08 PM 0.3 mL 05/15/2019 Intramuscular   Manufacturer: [employer], [employer]   Lot: [id number]   NDC: [address]-1

## 2019-08-10 ENCOUNTER — Ambulatory Visit: Payer: Self-pay | Attending: Internal Medicine

## 2019-08-10 DIAGNOSIS — Z23 Encounter for immunization: Secondary | ICD-10-CM | POA: Insufficient documentation

## 2019-08-10 NOTE — Progress Notes (Signed)
   Covid-19 Vaccination Clinic  Name:  Brenda Roberson    MRN: [id number] DOB: [birth year]  08/10/2019  [name] was observed post Covid-19 immunization for 15 minutes without incident. She was provided with Vaccine Information Sheet and instruction to access the V-Safe system.   [name] was instructed to call 911 with any severe reactions post vaccine: Marland Kitchen Difficulty breathing  . Swelling of face and throat  . A fast heartbeat  . A bad rash all over body  . Dizziness and weakness   Immunizations Administered    Name Date Dose VIS Date Route   Pfizer COVID-19 Vaccine 08/10/2019  6:19 PM 0.3 mL 05/15/2019 Intramuscular   Manufacturer: [employer], [employer]   Lot: [id number]   NDC: [address]-1

## 2019-10-27 DIAGNOSIS — I10 Essential (primary) hypertension: Secondary | ICD-10-CM | POA: Diagnosis not present

## 2019-10-27 DIAGNOSIS — L237 Allergic contact dermatitis due to plants, except food: Secondary | ICD-10-CM | POA: Diagnosis not present

## 2019-12-03 DIAGNOSIS — E782 Mixed hyperlipidemia: Secondary | ICD-10-CM | POA: Diagnosis not present

## 2019-12-03 DIAGNOSIS — Z0000 Encounter for general adult medical examination without abnormal findings: Secondary | ICD-10-CM | POA: Diagnosis not present

## 2019-12-03 DIAGNOSIS — I10 Essential (primary) hypertension: Secondary | ICD-10-CM | POA: Diagnosis not present

## 2020-06-15 DIAGNOSIS — I10 Essential (primary) hypertension: Secondary | ICD-10-CM | POA: Diagnosis not present

## 2020-06-15 DIAGNOSIS — K219 Gastro-esophageal reflux disease without esophagitis: Secondary | ICD-10-CM | POA: Diagnosis not present

## 2020-07-29 DIAGNOSIS — I10 Essential (primary) hypertension: Secondary | ICD-10-CM | POA: Diagnosis not present

## 2020-08-23 DIAGNOSIS — Z01419 Encounter for gynecological examination (general) (routine) without abnormal findings: Secondary | ICD-10-CM | POA: Diagnosis not present

## 2020-08-23 DIAGNOSIS — Z124 Encounter for screening for malignant neoplasm of cervix: Secondary | ICD-10-CM | POA: Diagnosis not present

## 2020-08-23 DIAGNOSIS — Z1231 Encounter for screening mammogram for malignant neoplasm of breast: Secondary | ICD-10-CM | POA: Diagnosis not present

## 2020-08-23 DIAGNOSIS — N915 Oligomenorrhea, unspecified: Secondary | ICD-10-CM | POA: Diagnosis not present

## 2020-08-23 DIAGNOSIS — Z6832 Body mass index (BMI) 32.0-32.9, adult: Secondary | ICD-10-CM | POA: Diagnosis not present

## 2020-08-23 DIAGNOSIS — Z01411 Encounter for gynecological examination (general) (routine) with abnormal findings: Secondary | ICD-10-CM | POA: Diagnosis not present

## 2020-08-23 DIAGNOSIS — L900 Lichen sclerosus et atrophicus: Secondary | ICD-10-CM | POA: Diagnosis not present

## 2020-08-23 DIAGNOSIS — N898 Other specified noninflammatory disorders of vagina: Secondary | ICD-10-CM | POA: Diagnosis not present

## 2020-09-13 DIAGNOSIS — N915 Oligomenorrhea, unspecified: Secondary | ICD-10-CM | POA: Diagnosis not present

## 2020-12-13 DIAGNOSIS — K219 Gastro-esophageal reflux disease without esophagitis: Secondary | ICD-10-CM | POA: Diagnosis not present

## 2020-12-13 DIAGNOSIS — Z0000 Encounter for general adult medical examination without abnormal findings: Secondary | ICD-10-CM | POA: Diagnosis not present

## 2020-12-13 DIAGNOSIS — I10 Essential (primary) hypertension: Secondary | ICD-10-CM | POA: Diagnosis not present

## 2020-12-13 DIAGNOSIS — E782 Mixed hyperlipidemia: Secondary | ICD-10-CM | POA: Diagnosis not present

## 2021-01-26 DIAGNOSIS — H04411 Chronic dacryocystitis of right lacrimal passage: Secondary | ICD-10-CM | POA: Diagnosis not present

## 2021-01-26 DIAGNOSIS — H02834 Dermatochalasis of left upper eyelid: Secondary | ICD-10-CM | POA: Diagnosis not present

## 2021-01-26 DIAGNOSIS — H04223 Epiphora due to insufficient drainage, bilateral lacrimal glands: Secondary | ICD-10-CM | POA: Diagnosis not present

## 2021-01-26 DIAGNOSIS — H04553 Acquired stenosis of bilateral nasolacrimal duct: Secondary | ICD-10-CM | POA: Diagnosis not present

## 2021-01-26 DIAGNOSIS — H02831 Dermatochalasis of right upper eyelid: Secondary | ICD-10-CM | POA: Diagnosis not present

## 2021-01-26 DIAGNOSIS — H57813 Brow ptosis, bilateral: Secondary | ICD-10-CM | POA: Diagnosis not present

## 2021-04-10 DIAGNOSIS — H04553 Acquired stenosis of bilateral nasolacrimal duct: Secondary | ICD-10-CM | POA: Diagnosis not present

## 2021-04-10 DIAGNOSIS — H04221 Epiphora due to insufficient drainage, right lacrimal gland: Secondary | ICD-10-CM | POA: Diagnosis not present

## 2021-04-10 DIAGNOSIS — H04561 Stenosis of right lacrimal punctum: Secondary | ICD-10-CM | POA: Diagnosis not present

## 2021-04-10 DIAGNOSIS — H02831 Dermatochalasis of right upper eyelid: Secondary | ICD-10-CM | POA: Diagnosis not present

## 2021-04-10 DIAGNOSIS — H04223 Epiphora due to insufficient drainage, bilateral lacrimal glands: Secondary | ICD-10-CM | POA: Diagnosis not present

## 2021-04-10 DIAGNOSIS — H04551 Acquired stenosis of right nasolacrimal duct: Secondary | ICD-10-CM | POA: Diagnosis not present

## 2021-04-10 DIAGNOSIS — H04411 Chronic dacryocystitis of right lacrimal passage: Secondary | ICD-10-CM | POA: Diagnosis not present

## 2021-04-25 DIAGNOSIS — H02831 Dermatochalasis of right upper eyelid: Secondary | ICD-10-CM | POA: Diagnosis not present

## 2021-04-25 DIAGNOSIS — H57813 Brow ptosis, bilateral: Secondary | ICD-10-CM | POA: Diagnosis not present

## 2021-04-25 DIAGNOSIS — H04411 Chronic dacryocystitis of right lacrimal passage: Secondary | ICD-10-CM | POA: Diagnosis not present

## 2021-04-25 DIAGNOSIS — H04553 Acquired stenosis of bilateral nasolacrimal duct: Secondary | ICD-10-CM | POA: Diagnosis not present

## 2021-04-25 DIAGNOSIS — H04223 Epiphora due to insufficient drainage, bilateral lacrimal glands: Secondary | ICD-10-CM | POA: Diagnosis not present

## 2021-04-25 DIAGNOSIS — H02834 Dermatochalasis of left upper eyelid: Secondary | ICD-10-CM | POA: Diagnosis not present

## 2021-05-18 DIAGNOSIS — H02831 Dermatochalasis of right upper eyelid: Secondary | ICD-10-CM | POA: Diagnosis not present

## 2021-05-18 DIAGNOSIS — H04223 Epiphora due to insufficient drainage, bilateral lacrimal glands: Secondary | ICD-10-CM | POA: Diagnosis not present

## 2021-05-18 DIAGNOSIS — H57813 Brow ptosis, bilateral: Secondary | ICD-10-CM | POA: Diagnosis not present

## 2021-05-18 DIAGNOSIS — H04411 Chronic dacryocystitis of right lacrimal passage: Secondary | ICD-10-CM | POA: Diagnosis not present

## 2021-05-18 DIAGNOSIS — H02834 Dermatochalasis of left upper eyelid: Secondary | ICD-10-CM | POA: Diagnosis not present

## 2021-05-18 DIAGNOSIS — H04553 Acquired stenosis of bilateral nasolacrimal duct: Secondary | ICD-10-CM | POA: Diagnosis not present

## 2022-02-09 LAB — HM PAP SMEAR: HPV, high-risk: NEGATIVE

## 2023-02-17 LAB — HM PAP SMEAR

## 2024-02-18 ENCOUNTER — Encounter: Payer: Self-pay | Admitting: Radiology

## 2024-02-18 ENCOUNTER — Ambulatory Visit (INDEPENDENT_AMBULATORY_CARE_PROVIDER_SITE_OTHER): Payer: Self-pay | Admitting: Radiology

## 2024-02-18 VITALS — BP 154/86 | HR 87 | Ht 63.0 in | Wt 188.0 lb

## 2024-02-18 DIAGNOSIS — Z1331 Encounter for screening for depression: Secondary | ICD-10-CM | POA: Diagnosis not present

## 2024-02-18 DIAGNOSIS — Z79890 Hormone replacement therapy (postmenopausal): Secondary | ICD-10-CM | POA: Diagnosis not present

## 2024-02-18 DIAGNOSIS — N951 Menopausal and female climacteric states: Secondary | ICD-10-CM | POA: Diagnosis not present

## 2024-02-18 DIAGNOSIS — N958 Other specified menopausal and perimenopausal disorders: Secondary | ICD-10-CM

## 2024-02-18 DIAGNOSIS — Z01419 Encounter for gynecological examination (general) (routine) without abnormal findings: Secondary | ICD-10-CM | POA: Diagnosis not present

## 2024-02-18 MED ORDER — ESTRADIOL 0.05 MG/24HR TD PTTW: 1.0000 | MEDICATED_PATCH | TRANSDERMAL | 4 refills | Status: AC

## 2024-02-18 MED ORDER — ESTRADIOL 0.1 MG/GM VA CREA: 1.0000 g | TOPICAL_CREAM | VAGINAL | 3 refills | Status: AC

## 2024-02-18 MED ORDER — PROGESTERONE MICRONIZED 100 MG PO CAPS: 200.0000 mg | ORAL_CAPSULE | Freq: Every evening | ORAL | 4 refills | Status: AC

## 2024-02-18 NOTE — Progress Notes (Signed)
 Brenda Roberson November 30, 1970 [id number]   History:  53 y.o. G4P2 presents for annual exam. Transfer from [employer], Dr Fawn retired. Ran out of HRT, has had some spotting since. Otherwise doing well. Has white coat syndrome. On BP meds, normal BP at home. C/o vaginal dryness.  Gynecologic History Patient's last menstrual period was 10/26/2012.   Contraception/Family planning: post menopausal status Sexually active: yes Last Pap: 2024. Results were: normal per pt at [facility] Last mammogram: 2024. Results were: normal per pt at [facility]  Obstetric History OB History  Gravida Para Term Preterm AB Living  4 2   2 2   SAB IAB Ectopic Multiple Live Births  2    2    # Outcome Date GA Lbr Len/2nd Weight Sex Type Anes PTL Lv  4 SAB           3 SAB           2 Para           1 Para                02/18/2024   10:51 AM  Depression screen PHQ 2/9  Decreased Interest 0  Down, Depressed, Hopeless 0  PHQ - 2 Score 0     The following portions of the patient's history were reviewed and updated as appropriate: allergies, current medications, past family history, past medical history, past social history, past surgical history, and problem list.  Review of Systems  All other systems reviewed and are negative.   Past medical history, past surgical history, family history and social history were all reviewed and documented in the EPIC chart.  Exam:  Vitals:   02/18/24 1043 02/18/24 1051  BP: (!) 166/92 (!) 154/86  Pulse: 87   SpO2: 99%   Weight: 188 lb (85.3 kg)   Height: 5' 3 (1.6 m)    Body mass index is 33.3 kg/m.  Physical Exam Vitals and nursing note reviewed. Exam conducted with a chaperone present.  Constitutional:      Appearance: Normal appearance. She is normal weight.  HENT:     Head: Normocephalic and atraumatic.  Neck:     Thyroid : No thyroid  mass, thyromegaly or thyroid  tenderness.  Cardiovascular:     Rate and Rhythm: Regular rhythm.     Heart sounds:  Normal heart sounds.  Pulmonary:     Effort: Pulmonary effort is normal.     Breath sounds: Normal breath sounds.  Chest:  Breasts:    Breasts are symmetrical.     Right: Normal. No inverted nipple, mass, nipple discharge, skin change or tenderness.     Left: Normal. No inverted nipple, mass, nipple discharge, skin change or tenderness.  Abdominal:     General: Abdomen is flat. Bowel sounds are normal.     Palpations: Abdomen is soft.  Genitourinary:    General: Normal vulva.     Vagina: Normal. No vaginal discharge, bleeding or lesions.     Cervix: Normal. No discharge or lesion.     Uterus: Normal. Not enlarged and not tender.      Adnexa: Right adnexa normal and left adnexa normal.       Right: No mass, tenderness or fullness.         Left: No mass, tenderness or fullness.       Comments: Atrophic vulva and vaginal tissue Lymphadenopathy:     Upper Body:     Right upper body: No axillary adenopathy.     Left  upper body: No axillary adenopathy.  Skin:    General: Skin is warm and dry.  Neurological:     Mental Status: She is alert and oriented to person, place, and time.  Psychiatric:        Mood and Affect: Mood normal.        Thought Content: Thought content normal.        Judgment: Judgment normal.      Darice Hoit, CMA present for exam  Assessment/Plan:   1. Well woman exam with routine gynecological exam (Primary) Pap 2027 Schedule screening mammogram  2. Menopausal syndrome on hormone replacement therapy - estradiol  (VIVELLE -DOT) 0.05 MG/24HR patch; Place 1 patch (0.05 mg total) onto the skin 2 (two) times a week.  Dispense: 24 patch; Refill: 4 - progesterone  (PROMETRIUM ) 100 MG capsule; Take 2 capsules (200 mg total) by mouth at bedtime.  Dispense: 180 capsule; Refill: 4  3. Genitourinary syndrome of menopause - estradiol  (ESTRACE  VAGINAL) 0.1 MG/GM vaginal cream; Place 1 g vaginally 3 (three) times a week.  Dispense: 42.5 g; Refill: 3  4. Depression  screening    Return in about 1 year (around 02/17/2025) for Annual.  GINETTE SHASTA NOVAK WHNP-BC 11:43 AM 02/18/2024

## 2024-03-13 ENCOUNTER — Other Ambulatory Visit (HOSPITAL_BASED_OUTPATIENT_CLINIC_OR_DEPARTMENT_OTHER): Payer: Self-pay | Admitting: Radiology

## 2024-03-13 DIAGNOSIS — Z1231 Encounter for screening mammogram for malignant neoplasm of breast: Secondary | ICD-10-CM

## 2024-03-16 ENCOUNTER — Encounter (HOSPITAL_BASED_OUTPATIENT_CLINIC_OR_DEPARTMENT_OTHER): Payer: Self-pay | Admitting: Radiology

## 2024-03-16 ENCOUNTER — Ambulatory Visit (HOSPITAL_BASED_OUTPATIENT_CLINIC_OR_DEPARTMENT_OTHER)
Admission: RE | Admit: 2024-03-16 | Discharge: 2024-03-16 | Disposition: A | Discharge status: Home / Self Care | Source: Ambulatory Visit

## 2024-03-16 DIAGNOSIS — Z1231 Encounter for screening mammogram for malignant neoplasm of breast: Secondary | ICD-10-CM | POA: Diagnosis present

## 2025-02-18 ENCOUNTER — Ambulatory Visit: Admitting: Radiology
